# Patient Record
Sex: Female | Born: 1965 | Hispanic: Yes | Marital: Married | Smoking: Never smoker
Health system: Southern US, Community
[De-identification: ages and names within clinical notes are randomized; demographics above are authoritative.]

---

## 2017-02-11 ENCOUNTER — Other Ambulatory Visit: Payer: Self-pay

## 2017-02-19 LAB — CYTOLOGY - PAP: Diagnosis: NEGATIVE

## 2017-09-09 ENCOUNTER — Emergency Department (HOSPITAL_BASED_OUTPATIENT_CLINIC_OR_DEPARTMENT_OTHER)
Admission: EM | Admit: 2017-09-09 | Discharge: 2017-09-09 | Disposition: A | Discharge status: Home / Self Care | Payer: Worker's Compensation | Attending: Emergency Medicine | Admitting: Emergency Medicine

## 2017-09-09 ENCOUNTER — Other Ambulatory Visit: Payer: Self-pay

## 2017-09-09 ENCOUNTER — Emergency Department (HOSPITAL_BASED_OUTPATIENT_CLINIC_OR_DEPARTMENT_OTHER): Payer: Worker's Compensation

## 2017-09-09 ENCOUNTER — Encounter (HOSPITAL_BASED_OUTPATIENT_CLINIC_OR_DEPARTMENT_OTHER): Payer: Self-pay | Admitting: Emergency Medicine

## 2017-09-09 DIAGNOSIS — S99912A Unspecified injury of left ankle, initial encounter: Secondary | ICD-10-CM | POA: Diagnosis present

## 2017-09-09 DIAGNOSIS — Y998 Other external cause status: Secondary | ICD-10-CM | POA: Insufficient documentation

## 2017-09-09 DIAGNOSIS — S93402A Sprain of unspecified ligament of left ankle, initial encounter: Secondary | ICD-10-CM | POA: Diagnosis not present

## 2017-09-09 DIAGNOSIS — Y939 Activity, unspecified: Secondary | ICD-10-CM | POA: Diagnosis not present

## 2017-09-09 DIAGNOSIS — W0110XA Fall on same level from slipping, tripping and stumbling with subsequent striking against unspecified object, initial encounter: Secondary | ICD-10-CM | POA: Diagnosis not present

## 2017-09-09 DIAGNOSIS — Y929 Unspecified place or not applicable: Secondary | ICD-10-CM | POA: Diagnosis not present

## 2017-09-09 DIAGNOSIS — M25562 Pain in left knee: Secondary | ICD-10-CM

## 2017-09-09 MED ORDER — IBUPROFEN 600 MG PO TABS: 600.0000 mg | ORAL_TABLET | Freq: Four times a day (QID) | ORAL | 0 refills | Status: AC | PRN

## 2017-09-09 MED FILL — IBUPROFEN 600 MG TABLET: 600 | 8 days supply | Qty: 30 | Fill #0

## 2017-09-09 NOTE — ED Provider Notes (Signed)
[facility] EMERGENCY DEPARTMENT Provider Note   CSN: [id number] Arrival date & time: 09/09/17  1230     History   Chief Complaint Chief Complaint  Patient presents with  . Fall    HPI Tammie Pope is a 51 y.o. female.  HPI  51 y.o. female , presents to the Emergency Department today due to left knee and left ankle pain. Pt notes mechanical fall yesterday. Slipped on surface that caused her to strike left knee and twist ankle. Rates pain 4/10. Throbbing. Worse with movement. Minimal at rest. No head trauma or LOC. No Numbness/tingling. Motrin PRN. No other symptoms noted.    History reviewed. No pertinent past medical history.  There are no active problems to display for this patient.   History reviewed. No pertinent surgical history.  OB History    No data available       Home Medications    Prior to Admission medications   Not on File    Family History History reviewed. No pertinent family history.  Social History Social History   Tobacco Use  . Smoking status: Never Smoker  . Smokeless tobacco: Never Used  Substance Use Topics  . Alcohol use: No    Frequency: Never  . Drug use: No     Allergies   Patient has no known allergies.   Review of Systems Review of Systems ROS reviewed and all are negative for acute change except as noted in the HPI.  Physical Exam Updated Vital Signs BP (!) 152/68 (BP Location: Left Arm)   Pulse 82   Temp 98.7 F (37.1 C) (Oral)   Resp 16   Ht 5' (1.524 m)   Wt 102.1 kg (225 lb)   SpO2 100%   BMI 43.94 kg/m   Physical Exam  Constitutional: She is oriented to person, place, and time. Vital signs are normal. She appears well-developed and well-nourished.  HENT:  Head: Normocephalic.  Right Ear: Hearing normal.  Left Ear: Hearing normal.  Eyes: Conjunctivae and EOM are normal. Pupils are equal, round, and reactive to light.  Cardiovascular: Normal rate and regular rhythm.  Pulmonary/Chest:  Effort normal.  Musculoskeletal:  TTP left ankle on lateral aspect below lateral malleolus. Mild swelling. NO deformities noted. Mild TTP lateral knee without swelling. ROM intact. NVI. Distal pulses appreciated.   Neurological: She is alert and oriented to person, place, and time.  Skin: Skin is warm and dry.  Psychiatric: She has a normal mood and affect. Her speech is normal and behavior is normal. Thought content normal.  Nursing note and vitals reviewed.    ED Treatments / Results  Labs (all labs ordered are listed, but only abnormal results are displayed) Labs Reviewed - No data to display  EKG  EKG Interpretation None       Radiology Dg Ankle Complete Left  Result Date: 09/09/2017 CLINICAL DATA:  Acute left ankle pain and swelling after fall 3 days ago. EXAM: LEFT ANKLE COMPLETE - 3+ VIEW COMPARISON:  None. FINDINGS: There is no evidence of fracture, dislocation, or joint effusion. There is no evidence of arthropathy or other focal bone abnormality. Soft tissue swelling is seen over lateral malleolus. IMPRESSION: Soft tissue swelling seen over lateral malleolus suggesting ligamentous injury. No fracture or dislocation is noted. Electronically Signed   By: Lupita RaiderJames  Green Jr, M.D.   On: 09/09/2017 13:42   Dg Knee Complete 4 Views Left  Result Date: 09/09/2017 CLINICAL DATA:  Acute left knee pain after fall 3  days ago. EXAM: LEFT KNEE - COMPLETE 4+ VIEW COMPARISON:  None. FINDINGS: No evidence of fracture, dislocation, or joint effusion. Moderate narrowing of medial joint space is noted. Spurring is noted laterally and involving superior patella. Soft tissues are unremarkable. IMPRESSION: Moderate degenerative joint disease. No acute abnormality seen in the left knee. Electronically Signed   By: Lupita Raider, M.D.   On: 09/09/2017 13:44    Procedures Procedures (including critical care time)  Medications Ordered in ED Medications - No data to display   Initial Impression /  Assessment and Plan / ED Course  I have reviewed the triage vital signs and the nursing notes.  Pertinent labs & imaging results that were available during my care of the patient were reviewed by me and considered in my medical decision making (see chart for details).  Final Clinical Impressions(s) / ED Diagnoses   {I have reviewed and evaluated the relevant imaging studies.  {I have reviewed the relevant previous healthcare records.  {I obtained HPI from historian.   ED Course:  Assessment: Patient X-Ray negative for obvious fracture or dislocation.  Pt advised to follow up with PCP. Patient given ankle brace while in ED, conservative therapy recommended and discussed. Patient will be discharged home & is agreeable with above plan. Returns precautions discussed. Pt appears safe for discharge  Disposition/Plan:  DC Home Additional Verbal discharge instructions given and discussed with patient.  Pt Instructed to f/u with PCP in the next week for evaluation and treatment of symptoms. Return precautions given Pt acknowledges and agrees with plan  Supervising Physician Tilden Fossa, MD  Final diagnoses:  Sprain of left ankle, unspecified ligament, initial encounter  Acute pain of left knee    ED Discharge Orders    None       Audry Pili, PA-C 09/09/17 1408    Tilden Fossa, MD 09/10/17 [phone]

## 2017-09-09 NOTE — Discharge Instructions (Signed)
Please read and follow all provided instructions.  Your diagnoses today include:  1. Sprain of left ankle, unspecified ligament, initial encounter   2. Acute pain of left knee     Tests performed today include: Vital signs. See below for your results today.   Medications prescribed:  Take as prescribed   Home care instructions:  Follow any educational materials contained in this packet.  Follow-up instructions: Please follow-up with your primary care provider for further evaluation of symptoms and treatment   Return instructions:  Please return to the Emergency Department if you do not get better, if you get worse, or new symptoms OR  - Fever (temperature greater than 101.2F)  - Bleeding that does not stop with holding pressure to the area    -Severe pain (please note that you may be more sore the day after your accident)  - Chest Pain  - Difficulty breathing  - Severe nausea or vomiting  - Inability to tolerate food and liquids  - Passing out  - Skin becoming red around your wounds  - Change in mental status (confusion or lethargy)  - New numbness or weakness    Please return if you have any other emergent concerns.  Additional Information:  Your vital signs today were: BP (!) 152/68 (BP Location: Left Arm)    Pulse 82    Temp 98.7 F (37.1 C) (Oral)    Resp 16    Ht 5' (1.524 m)    Wt 102.1 kg (225 lb)    SpO2 100%    BMI 43.94 kg/m  If your blood pressure (BP) was elevated above 135/85 this visit, please have this repeated by your doctor within one month. ---------------

## 2018-04-29 IMAGING — DX DG KNEE COMPLETE 4+V*L*
4 series · 4 of 4 positions shown · non-contrast
Comparison: None.

CLINICAL DATA: Acute left knee pain after fall 3 days ago.

EXAM:
LEFT KNEE - COMPLETE 4+ VIEW

[knee ap]
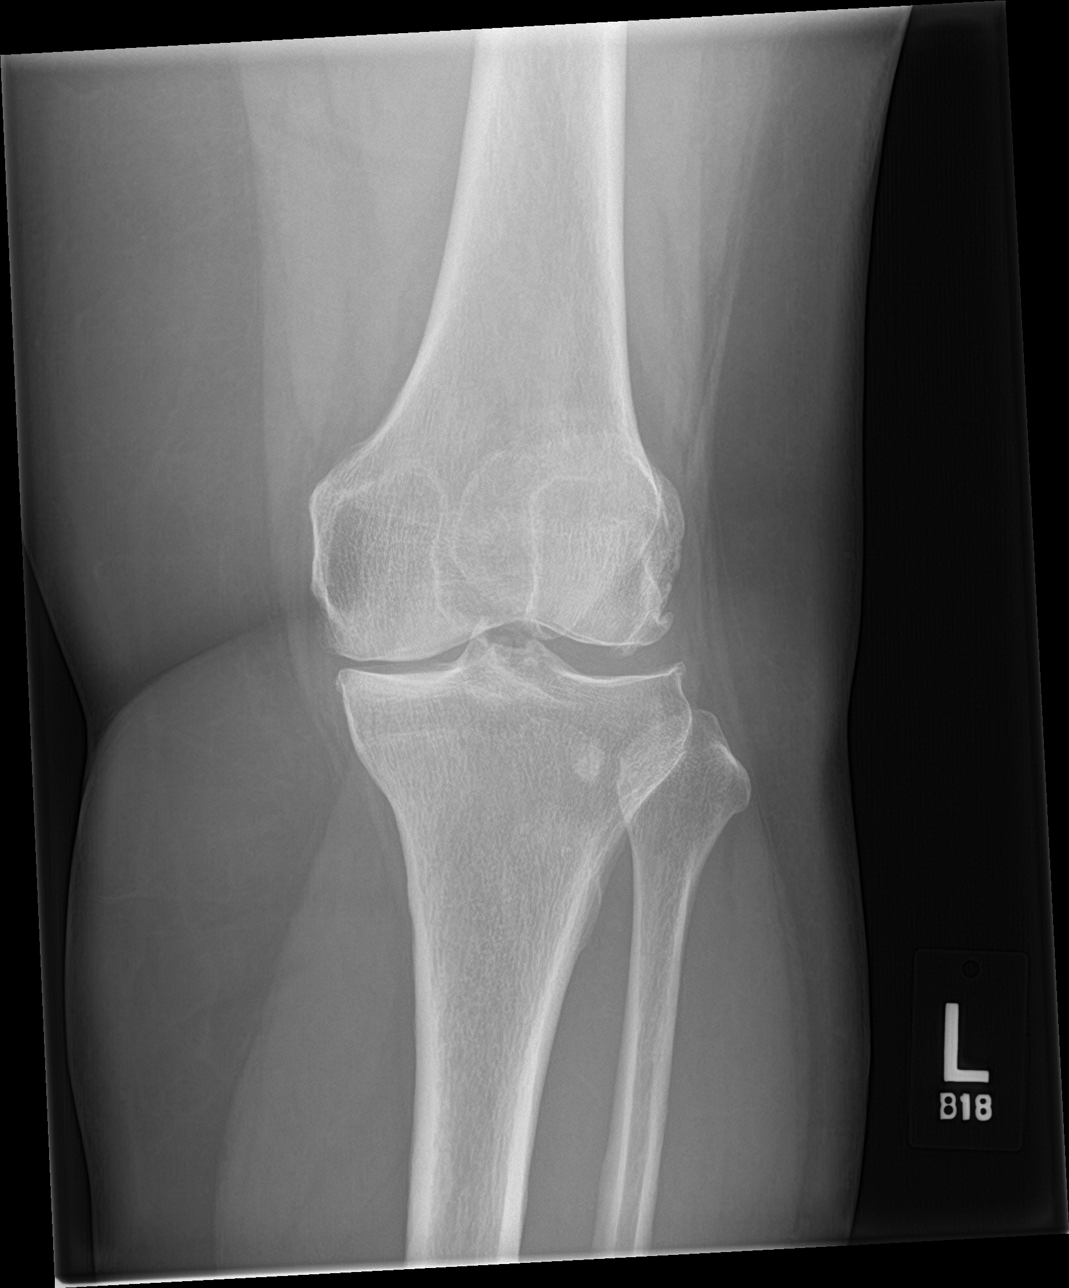

[knee lat]
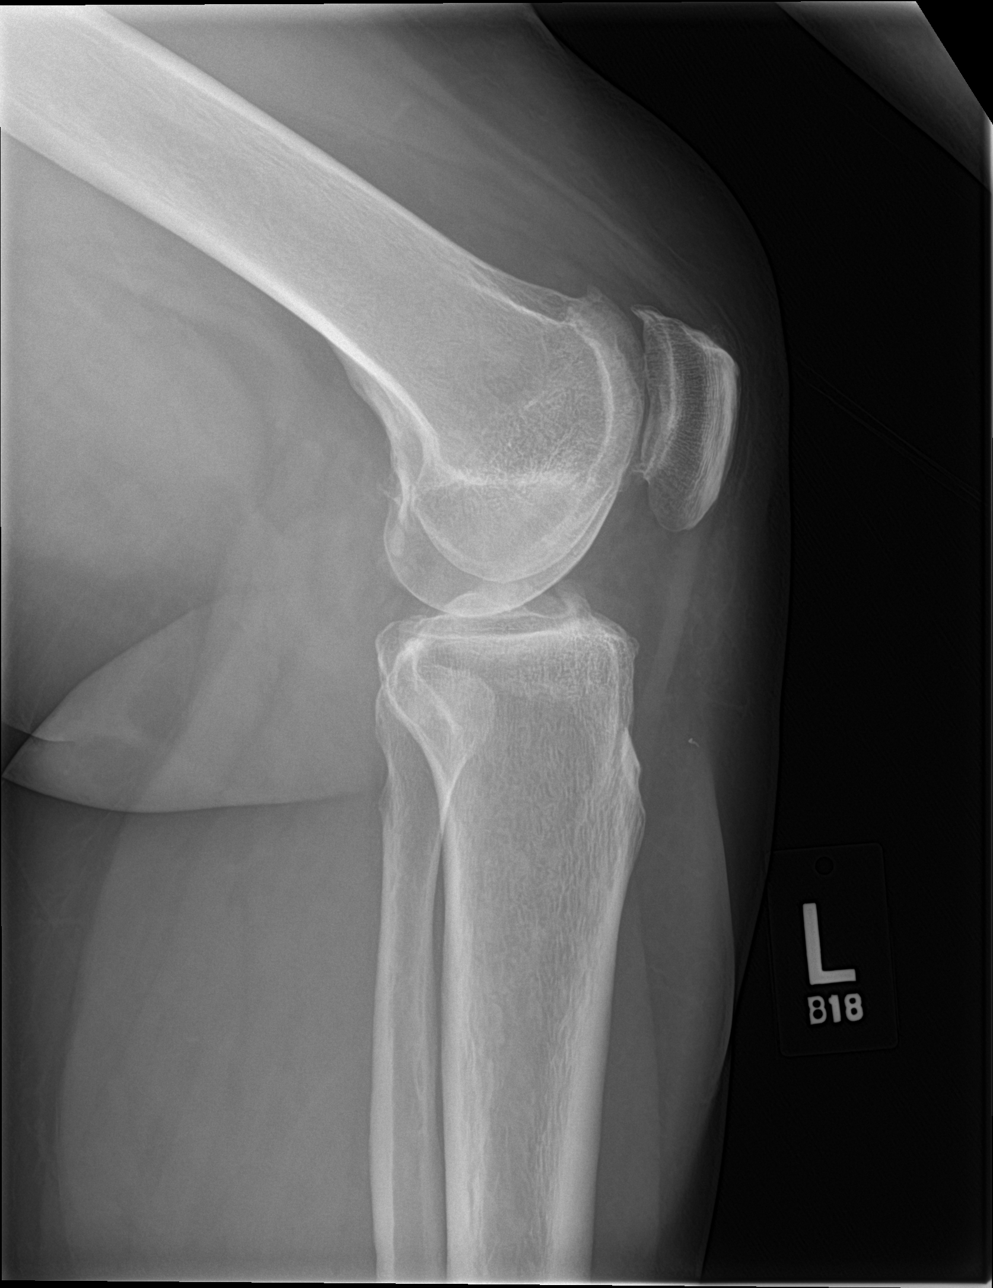

[knee obl (1 of 2)]
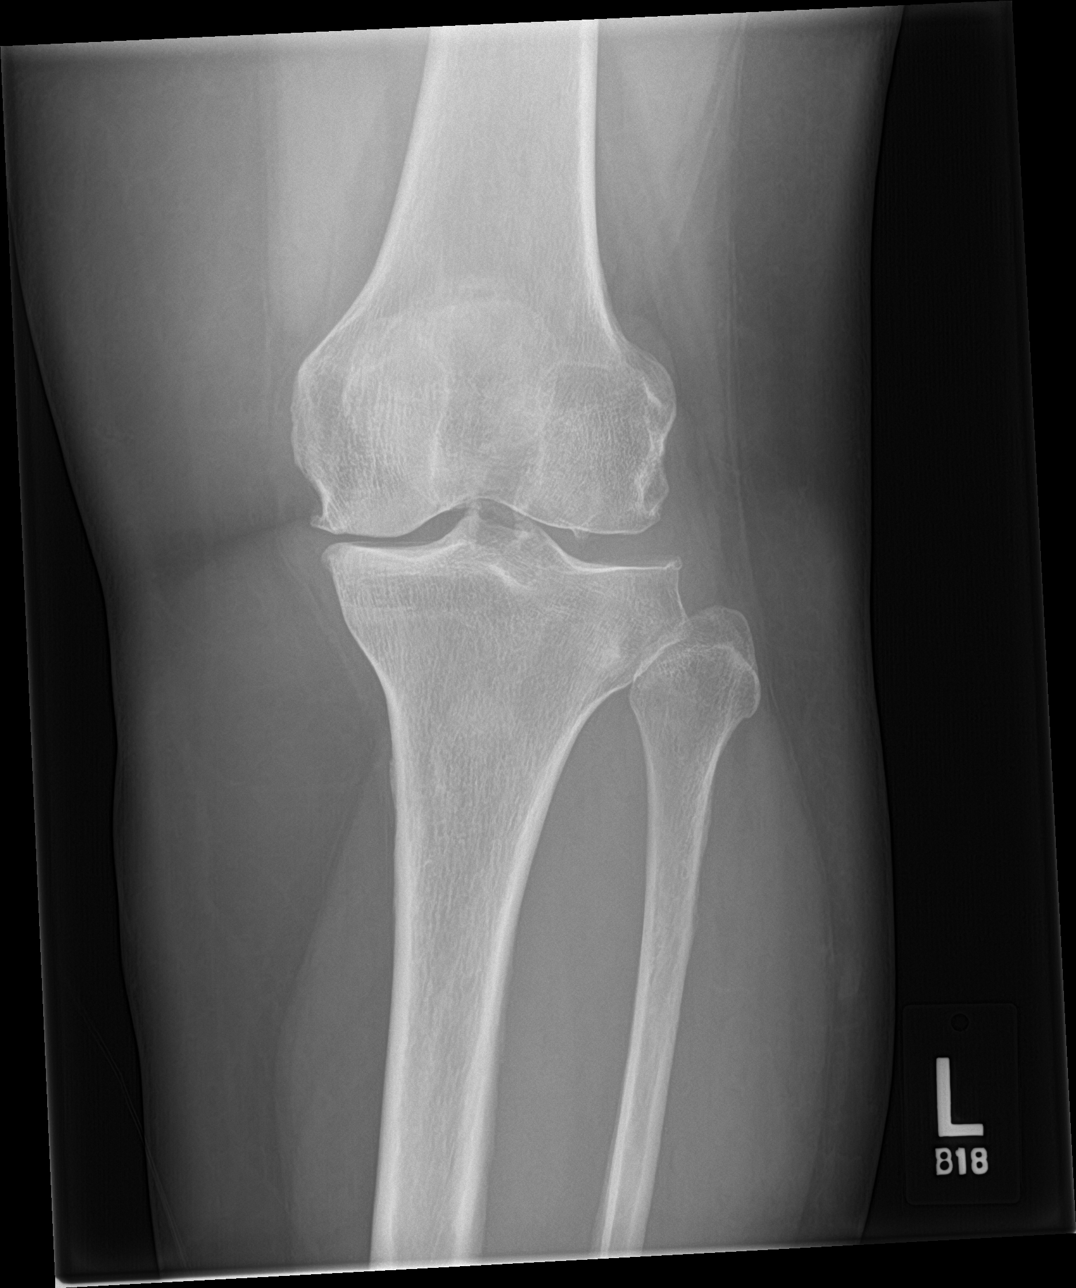

[knee obl (2 of 2)]
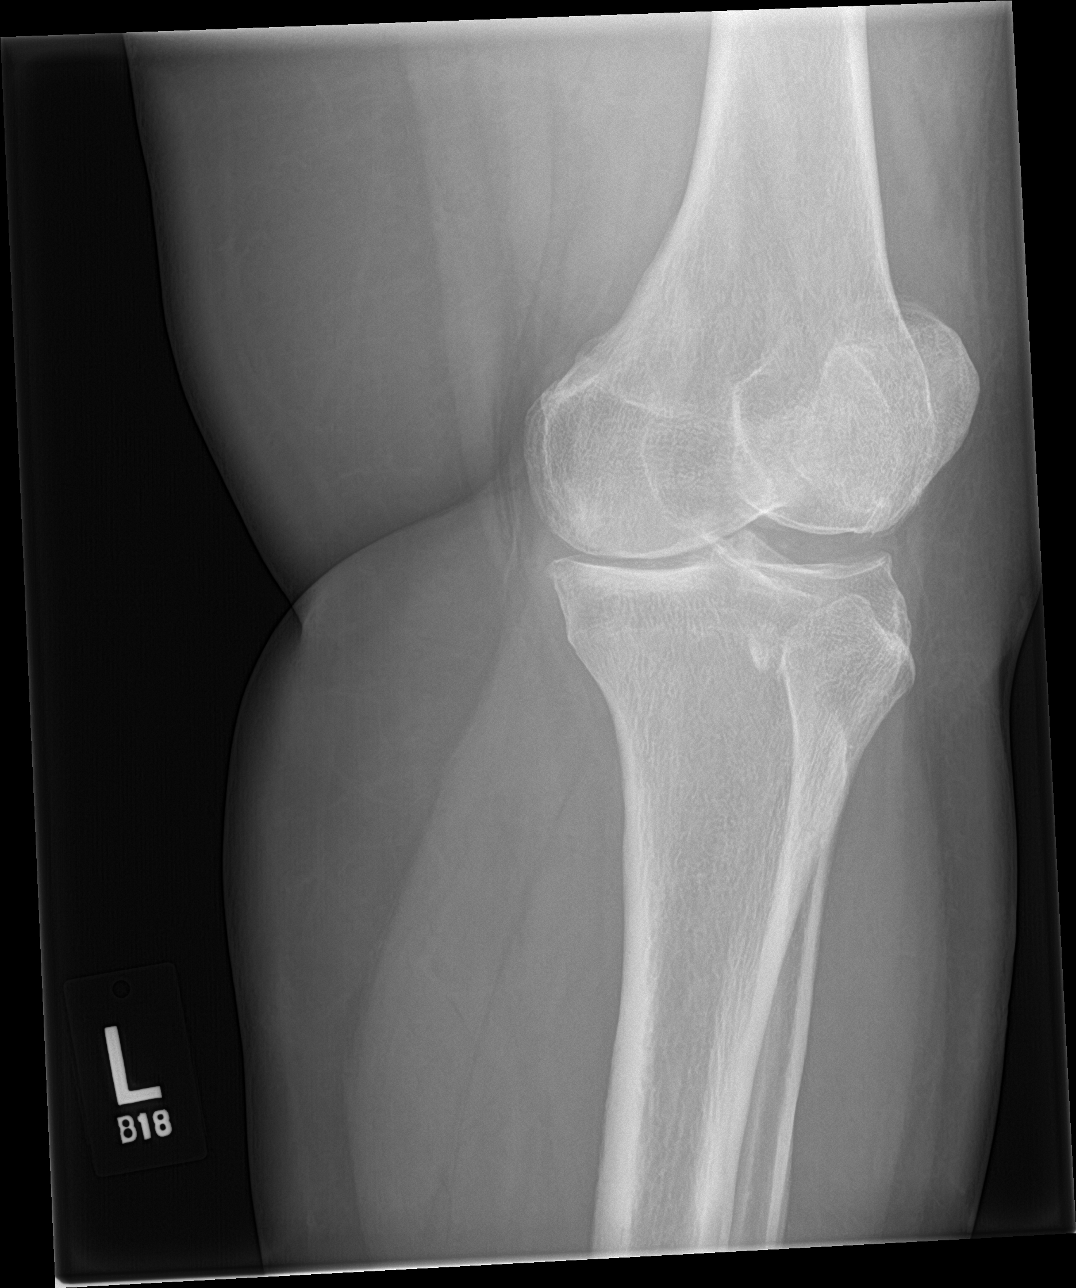

[4 of 4 positions shown; findings below may reference images not displayed]

FINDINGS: No evidence of fracture, dislocation, or joint effusion. Moderate
narrowing of medial joint space is noted. Spurring is noted
laterally and involving superior patella. Soft tissues are
unremarkable.
IMPRESSION: Moderate degenerative joint disease. No acute abnormality seen in
the left knee.

## 2023-05-14 ENCOUNTER — Other Ambulatory Visit: Payer: Self-pay | Admitting: Physician Assistant

## 2023-05-14 DIAGNOSIS — Z1231 Encounter for screening mammogram for malignant neoplasm of breast: Secondary | ICD-10-CM

## 2023-12-23 ENCOUNTER — Other Ambulatory Visit (INDEPENDENT_AMBULATORY_CARE_PROVIDER_SITE_OTHER): Payer: Self-pay

## 2023-12-23 ENCOUNTER — Ambulatory Visit (INDEPENDENT_AMBULATORY_CARE_PROVIDER_SITE_OTHER): Payer: Managed Care, Other (non HMO) | Admitting: Orthopedic Surgery

## 2023-12-23 DIAGNOSIS — M25561 Pain in right knee: Secondary | ICD-10-CM | POA: Diagnosis not present

## 2023-12-23 DIAGNOSIS — M25562 Pain in left knee: Secondary | ICD-10-CM

## 2023-12-23 DIAGNOSIS — G8929 Other chronic pain: Secondary | ICD-10-CM | POA: Diagnosis not present

## 2023-12-24 ENCOUNTER — Encounter: Payer: Self-pay | Admitting: Orthopedic Surgery

## 2023-12-24 DIAGNOSIS — M25562 Pain in left knee: Secondary | ICD-10-CM | POA: Diagnosis not present

## 2023-12-24 DIAGNOSIS — G8929 Other chronic pain: Secondary | ICD-10-CM

## 2023-12-24 DIAGNOSIS — M25561 Pain in right knee: Secondary | ICD-10-CM | POA: Diagnosis not present

## 2023-12-24 MED ORDER — METHYLPREDNISOLONE ACETATE 40 MG/ML IJ SUSP
40.0000 mg | INTRAMUSCULAR | Status: AC | PRN
Start: 2023-12-24 — End: 2023-12-24
  Administered 2023-12-24: 40 mg via INTRA_ARTICULAR

## 2023-12-24 MED ORDER — LIDOCAINE HCL 1 % IJ SOLN
5.0000 mL | INTRAMUSCULAR | Status: AC | PRN
Start: 2023-12-24 — End: 2023-12-24
  Administered 2023-12-24: 5 mL

## 2023-12-24 NOTE — Progress Notes (Signed)
Office Visit Note   Patient: Tammie Pope           Date of Birth: 1966-01-08           MRN: [id number] Visit Date: 12/23/2023              Requested by: Kathreen Cornfield, PA-C [address] [place],  [place] [address] PCP: No primary care provider on file.  Chief Complaint  Patient presents with   Right Knee - Pain   Left Knee - Pain      HPI: Patient is a 58 year old woman with bilateral knee pain left worse than right for the past several years.  Patient states has been progressively getting worse over the last several months.  She has had injections in the past.  Assessment & Plan: Visit Diagnoses:  1. Chronic pain of both knees     Plan: Both knees were injected she tolerated this well.  Recommended weight loss for consideration for total knee arthroplasty.  Follow-Up Instructions: Return in about 4 weeks (around 01/20/2024).   Ortho Exam  Patient is alert, oriented, no adenopathy, well-dressed, normal affect, normal respiratory effort. Examination of both knees she has varus alignment with standing she has an antalgic gait.  She has tenderness to palpation primarily over the medial joint line bilaterally left worse than right collaterals and cruciates are stable bilaterally.  Imaging: XR Knee 1-2 Views Left Result Date: 12/24/2023 2 view radiographs of the left knee shows varus alignment bone-on-bone contact medial joint line with subcondylar sclerosis and periarticular bony spurs in all 3 compartments.  XR Knee 1-2 Views Right Result Date: 12/24/2023 2 view radiographs of the right knee shows varus alignment bone-on-bone contact medial joint line with subcondylar sclerosis and periarticular bony spurs in all 3 compartments.  No images are attached to the encounter.  Labs: No results found for: "HGBA1C", "ESRSEDRATE", "CRP", "LABURIC", "REPTSTATUS", "GRAMSTAIN", "CULT", "LABORGA"   No results found for: "ALBUMIN", "PREALBUMIN", "CBC"  No results found  for: "MG" No results found for: "VD25OH"  No results found for: "PREALBUMIN"     No data to display           There is no height or weight on file to calculate BMI.  Orders:  Orders Placed This Encounter  Procedures   XR Knee 1-2 Views Right   XR Knee 1-2 Views Left   No orders of the defined types were placed in this encounter.    Procedures: Large Joint Inj: bilateral knee on 12/24/2023 8:28 AM Indications: pain and diagnostic evaluation Details: 22 G 1.5 in needle, anteromedial approach  Arthrogram: No  Medications (Right): 5 mL lidocaine 1 %; 40 mg methylPREDNISolone acetate 40 MG/ML Medications (Left): 5 mL lidocaine 1 %; 40 mg methylPREDNISolone acetate 40 MG/ML Outcome: tolerated well, no immediate complications Procedure, treatment alternatives, risks and benefits explained, specific risks discussed. Consent was given by the patient. Immediately prior to procedure a time out was called to verify the correct patient, procedure, equipment, support staff and site/side marked as required. Patient was prepped and draped in the usual sterile fashion.      Clinical Data: No additional findings.  ROS:  All other systems negative, except as noted in the HPI. Review of Systems  Objective: Vital Signs: There were no vitals taken for this visit.  Specialty Comments:  No specialty comments available.  PMFS History: There are no active problems to display for this patient.  History reviewed. No pertinent past medical history.  History reviewed. No pertinent family history.  History reviewed. No pertinent surgical history. Social History   Occupational History   Not on file  Tobacco Use   Smoking status: Never   Smokeless tobacco: Never  Substance and Sexual Activity   Alcohol use: No   Drug use: No   Sexual activity: Not on file

## 2024-01-20 ENCOUNTER — Ambulatory Visit: Payer: Managed Care, Other (non HMO) | Admitting: Orthopedic Surgery
